# Patient Record
Sex: Female | Born: 1975 | Race: Black or African American | Hispanic: No | Smoking: Never smoker
Health system: Southern US, Community
[De-identification: ages and names within clinical notes are randomized; demographics above are authoritative.]

## PROBLEM LIST (undated history)

## (undated) HISTORY — PX: TUBAL LIGATION: SHX77

## (undated) HISTORY — PX: BREAST SURGERY: SHX581

## (undated) HISTORY — PX: COSMETIC SURGERY: SHX468

---

## 2004-09-05 ENCOUNTER — Emergency Department (HOSPITAL_COMMUNITY): Admission: EM | Admit: 2004-09-05 | Discharge: 2004-09-05 | Payer: Self-pay | Admitting: Emergency Medicine

## 2005-07-26 ENCOUNTER — Other Ambulatory Visit: Admission: RE | Admit: 2005-07-26 | Discharge: 2005-07-26 | Payer: Self-pay | Admitting: Obstetrics & Gynecology

## 2005-12-30 ENCOUNTER — Inpatient Hospital Stay (HOSPITAL_COMMUNITY): Admission: AD | Admit: 2005-12-30 | Discharge: 2006-01-02 | Payer: Self-pay | Admitting: Obstetrics & Gynecology

## 2011-08-02 ENCOUNTER — Encounter: Payer: Self-pay | Admitting: *Deleted

## 2011-09-06 ENCOUNTER — Other Ambulatory Visit: Payer: Self-pay | Admitting: Obstetrics and Gynecology

## 2011-10-23 ENCOUNTER — Other Ambulatory Visit: Payer: Self-pay | Admitting: Internal Medicine

## 2011-10-23 DIAGNOSIS — R221 Localized swelling, mass and lump, neck: Secondary | ICD-10-CM

## 2011-11-21 ENCOUNTER — Other Ambulatory Visit: Payer: Self-pay

## 2012-06-28 ENCOUNTER — Ambulatory Visit: Payer: 59 | Admitting: Emergency Medicine

## 2012-06-28 VITALS — BP 120/80 | HR 93 | Temp 98.3°F | Resp 20 | Ht 64.0 in | Wt 116.0 lb

## 2012-06-28 DIAGNOSIS — R1011 Right upper quadrant pain: Secondary | ICD-10-CM

## 2012-06-28 NOTE — Progress Notes (Signed)
Subjective:    Patient ID: Amanda Rangel, female    DOB: 06/27/1976, 36 y.o.   MRN: [id number]  HPI Comments: Patient is actually complaining about epigastric pain that radiates around to the right side and back.  Pain free while laying still.  Worse when sneezes or laughs.. No food intolerance other than avoiding hamburgers.  No history of gall bladder disease.  Chest Pain  This is a new problem. The current episode started in the past 7 days. The onset quality is sudden. The problem occurs constantly. The problem has been gradually worsening. The pain is present in the lateral region. The pain is moderate. The quality of the pain is described as sharp. The pain radiates to the mid back. Associated symptoms include a cough. Pertinent negatives include no back pain, diaphoresis, dizziness, exertional chest pressure, irregular heartbeat, lower extremity edema, malaise/fatigue, nausea, near-syncope, numbness, palpitations, shortness of breath or weakness. The cough has no precipitants. The cough is productive. Nothing relieves the cough. The pain is aggravated by breathing, coughing, deep breathing and movement. She has tried nothing for the symptoms. Risk factors include no known risk factors.  Her past medical history is significant for diabetes.  Pertinent negatives for past medical history include no aneurysm, no anxiety/panic attacks, no aortic aneurysm, no aortic dissection, no arrhythmia, no bicuspid aortic valve, no cancer, no congenital heart disease, no connective tissue disease, no COPD, no CHF, no hyperhomocysteinemia, no hyperlipidemia, no hypertension, no Kawasaki disease, no mitral valve prolapse, no pacemaker, no PVD, no recent injury, no rheumatic fever, no seizures, no sickle cell disease, no sleep apnea, no spontaneous pneumothorax, no stimulant use, no strokes, no thyroid problem, no TIA, Turner syndrome and no valve disorder.  Her family medical history is significant for CAD in  family, heart disease in family and hypertension in family.  Pertinent negatives for family medical history include: family history of aortic dissection, no connective tissue disease in family, no diabetes in family, no hyperlipidemia in family, no Marfan's syndrome in family, no early MI in family, no PE in family, no PVD in family, no sickle cell disease in family, no stroke in family, no sudden death in family and no TIA in family.      Review of Systems  Constitutional: Negative.  Negative for malaise/fatigue and diaphoresis.  HENT: Negative.   Eyes: Negative.   Respiratory: Positive for cough. Negative for choking, chest tightness, shortness of breath, wheezing and stridor.   Cardiovascular: Positive for chest pain. Negative for palpitations, leg swelling and near-syncope.  Gastrointestinal: Negative.  Negative for nausea.  Genitourinary: Negative.   Musculoskeletal: Negative.  Negative for back pain.  Neurological: Negative for dizziness, seizures, weakness and numbness.       Objective:   Physical Exam  Constitutional: She is oriented to person, place, and time. She appears well-developed and well-nourished. She appears distressed.  HENT:  Head: Normocephalic and atraumatic.  Right Ear: External ear normal.  Left Ear: External ear normal.  Mouth/Throat: Oropharynx is clear and moist.  Eyes: Conjunctivae are normal. Pupils are equal, round, and reactive to light. No scleral icterus.  Neck: Normal range of motion. Neck supple.  Cardiovascular: Normal rate, regular rhythm and normal heart sounds.  Exam reveals no friction rub.   Pulmonary/Chest: Effort normal. She has no rales. She exhibits no tenderness.  Abdominal: Soft. She exhibits mass. There is tenderness (marked tenderness over RUQ.  questionable mass.  ). There is guarding.  Musculoskeletal: Normal range of motion.  Neurological:  She is alert and oriented to person, place, and time.  Skin: Skin is warm and dry.           Assessment & Plan:  RUQ pain To ER for workup tonight.

## 2014-01-23 ENCOUNTER — Other Ambulatory Visit: Payer: Self-pay | Admitting: Obstetrics and Gynecology

## 2015-04-06 ENCOUNTER — Other Ambulatory Visit: Payer: Self-pay | Admitting: Obstetrics and Gynecology

## 2015-04-07 LAB — CYTOLOGY - PAP

## 2016-04-12 ENCOUNTER — Other Ambulatory Visit: Payer: Self-pay | Admitting: Obstetrics and Gynecology

## 2016-04-13 ENCOUNTER — Other Ambulatory Visit: Payer: Self-pay | Admitting: Obstetrics and Gynecology

## 2017-04-20 ENCOUNTER — Ambulatory Visit (INDEPENDENT_AMBULATORY_CARE_PROVIDER_SITE_OTHER): Payer: Managed Care, Other (non HMO) | Admitting: Family Medicine

## 2017-04-20 ENCOUNTER — Ambulatory Visit (INDEPENDENT_AMBULATORY_CARE_PROVIDER_SITE_OTHER): Payer: Managed Care, Other (non HMO)

## 2017-04-20 ENCOUNTER — Encounter: Payer: Self-pay | Admitting: Family Medicine

## 2017-04-20 VITALS — BP 104/76 | HR 65 | Temp 98.3°F | Resp 16 | Ht 64.0 in | Wt 127.0 lb

## 2017-04-20 DIAGNOSIS — E119 Type 2 diabetes mellitus without complications: Secondary | ICD-10-CM | POA: Diagnosis not present

## 2017-04-20 DIAGNOSIS — R109 Unspecified abdominal pain: Secondary | ICD-10-CM

## 2017-04-20 LAB — POCT URINALYSIS DIP (MANUAL ENTRY)
BILIRUBIN UA: NEGATIVE mg/dL
Bilirubin, UA: NEGATIVE
GLUCOSE UA: NEGATIVE mg/dL
Leukocytes, UA: NEGATIVE
Nitrite, UA: NEGATIVE
Protein Ur, POC: NEGATIVE mg/dL
RBC UA: NEGATIVE
UROBILINOGEN UA: 0.2 U/dL
pH, UA: 5.5 (ref 5.0–8.0)

## 2017-04-20 LAB — GLUCOSE, POCT (MANUAL RESULT ENTRY): POC Glucose: 84 mg/dl (ref 70–99)

## 2017-04-20 LAB — POCT GLYCOSYLATED HEMOGLOBIN (HGB A1C): HEMOGLOBIN A1C: 5.7

## 2017-04-20 MED ORDER — FAMOTIDINE 20 MG PO TABS: 20.0000 mg | ORAL_TABLET | Freq: Two times a day (BID) | ORAL | 0 refills | Status: DC

## 2017-04-20 NOTE — Patient Instructions (Addendum)
     IF you received an x-ray today, you will receive an invoice from [facility]. Please contact [facility] at [phone] with questions or concerns regarding your invoice.   IF you received labwork today, you will receive an invoice from [facility]. Please contact [facility] at [phone] with questions or concerns regarding your invoice.   Our billing staff will not be able to assist you with questions regarding bills from these companies.  You will be contacted with the lab results as soon as they are available. The fastest way to get your results is to activate your My Chart account. Instructions are located on the last page of this paperwork. If you have not heard from us regarding the results in 2 weeks, please contact this office.     Abdominal Pain, Adult Many things can cause belly (abdominal) pain. Most times, belly pain is not dangerous. Many cases of belly pain can be watched and treated at home. Sometimes belly pain is serious, though. Your doctor will try to find the cause of your belly pain. Follow these instructions at home:  Take over-the-counter and prescription medicines only as told by your doctor. Do not take medicines that help you poop (laxatives) unless told to by your doctor.  Drink enough fluid to keep your pee (urine) clear or pale yellow.  Watch your belly pain for any changes.  Keep all follow-up visits as told by your doctor. This is important. Contact a doctor if:  Your belly pain changes or gets worse.  You are not hungry, or you lose weight without trying.  You are having trouble pooping (constipated) or have watery poop (diarrhea) for more than 2-3 days.  You have pain when you pee or poop.  Your belly pain wakes you up at night.  Your pain gets worse with meals, after eating, or with certain foods.  You are throwing up and cannot keep anything down.  You have a fever. Get help right away if:  Your pain does not go away  as soon as your doctor says it should.  You cannot stop throwing up.  Your pain is only in areas of your belly, such as the right side or the left lower part of the belly.  You have bloody or black poop, or poop that looks like tar.  You have very bad pain, cramping, or bloating in your belly.  You have signs of not having enough fluid or water in your body (dehydration), such as:  Dark pee, very little pee, or no pee.  Cracked lips.  Dry mouth.  Sunken eyes.  Sleepiness.  Weakness. This information is not intended to replace advice given to you by your health care provider. Make sure you discuss any questions you have with your health care provider. Document Released: 04/10/2008 Document Revised: 05/12/2016 Document Reviewed: 04/05/2016 Elsevier Interactive Patient Education  2017 Elsevier Inc.  

## 2017-04-20 NOTE — Progress Notes (Signed)
Chief Complaint  Patient presents with  . Back Pain    mid right back,  started today around 2;30 PM     HPI  Pt reports that she went to lunch at 2:30pm and could not eat because she started having back spasms in the upper back  She reports that she has no other injuries.  Denies numbness and tingling in her legs Reports that she is urinating without pain States that it happened while she was in line to get her lunch. She states that she has been hungry all morning   She has a history of diabetes that is diet controlled and well controlled  No past medical history on file.  Current Outpatient Prescriptions  Medication Sig Dispense Refill  . famotidine (PEPCID AC MAXIMUM STRENGTH) 20 MG tablet Take 1 tablet (20 mg total) by mouth 2 (two) times daily. 20 tablet 0  . metFORMIN (GLUCOPHAGE) 500 MG tablet Take 500 mg by mouth daily with breakfast.     No current facility-administered medications for this visit.     Allergies: No Known Allergies  Past Surgical History:  Procedure Laterality Date  . TUBAL LIGATION      Social History   Social History  . Marital status: Divorced    Spouse name: N/A  . Number of children: N/A  . Years of education: N/A   Social History Main Topics  . Smoking status: Never Smoker  . Smokeless tobacco: Never Used  . Alcohol use 1.8 oz/week    3 Standard drinks or equivalent per week  . Drug use: No  . Sexual activity: Not Asked   Other Topics Concern  . None   Social History Narrative  . None    Review of Systems  Constitutional: Negative for chills and fever.  Respiratory: Negative for cough and wheezing.   Cardiovascular: Negative for chest pain and palpitations.  Gastrointestinal: Negative for abdominal pain, nausea and vomiting.  Musculoskeletal: Positive for back pain and joint pain. Negative for falls, myalgias and neck pain.    Objective: Vitals:   04/20/17 1642  BP: 104/76  Pulse: 65  Resp: 16  Temp: 98.3 F  (36.8 C)  TempSrc: Oral  SpO2: 100%  Weight: 127 lb (57.6 kg)  Height: 5\' 4"  (1.626 m)    Physical Exam  Constitutional: She is oriented to person, place, and time. She appears well-developed and well-nourished.  HENT:  Head: Normocephalic and atraumatic.  Eyes: Conjunctivae and EOM are normal.  Cardiovascular: Normal rate, regular rhythm and normal heart sounds.   Pulmonary/Chest: Effort normal and breath sounds normal. No respiratory distress.  Musculoskeletal:  Straight leg raise negative.  No SI joint pain. No pain on palpation of the low back or spinous processes  Neurological: She is alert and oriented to person, place, and time. She displays normal reflexes. She exhibits normal muscle tone. Coordination normal.      Lab Results  Component Value Date   HGBA1C 5.7 04/20/2017     Component                           Latest Ref Rng & Units 04/20/2017  POC Glucose                           70 - 99 mg/dl 84   Component     Latest Ref Rng & Units 04/20/2017 04/20/2017  5:34 PM  5:34 PM  Color, UA     yellow yellow   Clarity, UA     clear clear   Glucose     negative mg/dL negative   Bilirubin, UA     negative negative negative  Specific Gravity, UA     1.010 - 1.025 >=1.030 (A)   RBC, UA     negative negative   pH, UA     5.0 - 8.0 5.5   Protein, UA     negative mg/dL negative   Urobilinogen, UA     0.2 or 1.0 E.U./dL 0.2   Nitrite, UA     Negative Negative   Leukocytes, UA     Negative Negative     EXAM: ABDOMEN - 1 VIEW  COMPARISON:  None.  FINDINGS: There is moderate stool in the colon. There is no bowel dilatation or air-fluid level to suggest bowel obstruction. No free air. There are phleboliths in the pelvis.  IMPRESSION: Moderate stool in colon.  No bowel obstruction or free air evident.   Electronically Signed   By: Bretta BangWilliam  Woodruff Rangel M.D.   On: 04/20/2017 17:29    Assessment and Plan Amanda Rangel was seen today for back  pain.  Diagnoses and all orders for this visit:  Flank pain- advised pt that her urine is clear, her xray is negative for constipation or dilated loops of bowel. Pt to drink water and eat to see if this is just gas pain.  If pain persists then return to clinic  -     DG Abd 1 View -     POCT urinalysis dipstick  Diet-controlled diabetes mellitus (HCC)-  Diabetes well controlled with dietary changes -     POCT glucose (manual entry) -     POCT glycosylated hemoglobin (Hb A1C)  Other orders -     famotidine (PEPCID AC MAXIMUM STRENGTH) 20 MG tablet; Take 1 tablet (20 mg total) by mouth 2 (two) times daily.   A total of 30 minutes were spent face-to-face with the patient during this encounter and over half of that time was spent on counseling and coordination of care.   Amanda Rangel

## 2018-06-12 LAB — HM PAP SMEAR: HM Pap smear: NORMAL

## 2019-01-23 ENCOUNTER — Telehealth: Payer: Self-pay | Admitting: Physician Assistant

## 2019-01-23 DIAGNOSIS — J069 Acute upper respiratory infection, unspecified: Secondary | ICD-10-CM

## 2019-01-23 DIAGNOSIS — B9789 Other viral agents as the cause of diseases classified elsewhere: Secondary | ICD-10-CM

## 2019-01-23 MED ORDER — BENZONATATE 100 MG PO CAPS: 100.0000 mg | ORAL_CAPSULE | Freq: Three times a day (TID) | ORAL | 0 refills | Status: DC | PRN

## 2019-01-23 NOTE — Progress Notes (Signed)
I have spent 5 minutes in review of e-visit questionnaire, review and updating patient chart, medical decision making and response to patient.   Kenyada Dosch Cody Uel Davidow, PA-C    

## 2019-01-23 NOTE — Progress Notes (Signed)

## 2019-08-11 ENCOUNTER — Other Ambulatory Visit: Payer: Self-pay | Admitting: Obstetrics and Gynecology

## 2019-08-11 DIAGNOSIS — R928 Other abnormal and inconclusive findings on diagnostic imaging of breast: Secondary | ICD-10-CM

## 2019-08-14 ENCOUNTER — Other Ambulatory Visit: Payer: Self-pay

## 2019-08-14 ENCOUNTER — Ambulatory Visit
Admission: RE | Admit: 2019-08-14 | Discharge: 2019-08-14 | Disposition: A | Discharge status: Home / Self Care | Payer: Managed Care, Other (non HMO) | Source: Ambulatory Visit | Attending: Obstetrics and Gynecology | Admitting: Obstetrics and Gynecology

## 2019-08-14 DIAGNOSIS — R928 Other abnormal and inconclusive findings on diagnostic imaging of breast: Secondary | ICD-10-CM

## 2019-10-08 ENCOUNTER — Other Ambulatory Visit: Payer: Self-pay

## 2019-10-08 DIAGNOSIS — Z20822 Contact with and (suspected) exposure to covid-19: Secondary | ICD-10-CM

## 2019-10-10 LAB — NOVEL CORONAVIRUS, NAA: SARS-CoV-2, NAA: NOT DETECTED

## 2021-12-07 ENCOUNTER — Other Ambulatory Visit: Payer: Self-pay | Admitting: Obstetrics and Gynecology

## 2021-12-07 DIAGNOSIS — Z1231 Encounter for screening mammogram for malignant neoplasm of breast: Secondary | ICD-10-CM

## 2021-12-16 ENCOUNTER — Other Ambulatory Visit: Payer: Self-pay | Admitting: Obstetrics and Gynecology

## 2021-12-16 ENCOUNTER — Ambulatory Visit
Admission: RE | Admit: 2021-12-16 | Discharge: 2021-12-16 | Disposition: A | Discharge status: Home / Self Care | Payer: Managed Care, Other (non HMO) | Source: Ambulatory Visit | Attending: Obstetrics and Gynecology | Admitting: Obstetrics and Gynecology

## 2021-12-16 DIAGNOSIS — Z1231 Encounter for screening mammogram for malignant neoplasm of breast: Secondary | ICD-10-CM

## 2022-08-07 LAB — HEMOGLOBIN A1C: Hemoglobin A1C: 6

## 2022-08-07 LAB — LIPID PANEL: LDL Cholesterol: 119

## 2022-09-21 DIAGNOSIS — N926 Irregular menstruation, unspecified: Secondary | ICD-10-CM | POA: Insufficient documentation

## 2022-11-29 ENCOUNTER — Encounter: Payer: Self-pay | Admitting: Family Medicine

## 2022-11-29 ENCOUNTER — Ambulatory Visit: Payer: 59 | Admitting: Family Medicine

## 2022-11-29 VITALS — BP 104/72 | HR 76 | Temp 97.6°F | Ht 64.0 in | Wt 153.0 lb

## 2022-11-29 DIAGNOSIS — R7303 Prediabetes: Secondary | ICD-10-CM | POA: Insufficient documentation

## 2022-11-29 DIAGNOSIS — R233 Spontaneous ecchymoses: Secondary | ICD-10-CM

## 2022-11-29 DIAGNOSIS — E785 Hyperlipidemia, unspecified: Secondary | ICD-10-CM | POA: Diagnosis not present

## 2022-11-29 DIAGNOSIS — Z7689 Persons encountering health services in other specified circumstances: Secondary | ICD-10-CM

## 2022-11-29 NOTE — Progress Notes (Signed)
New Patient Office Visit  Subjective    Patient ID: Amanda Rangel, female    DOB: 04-07-1976  Age: 47 y.o. MRN: [id number]  CC:  Chief Complaint  Patient presents with   Establish Care    Cholesterol levels and A1c per OBGYN    HPI Amanda Rangel presents to establish care  OB/GYN - Dr. Philis Pique  States she has prediabetes - she took metformin for approx 6 months.  Last A1c 5.9%.  She went to a nutritionist.   States her cholesterol was also elevated. Cannot recall LDL or HDL.   Bruises easily. No bleeding.   Denies fever, chills, dizziness, chest pain, palpitations, shortness of breath, abdominal pain, N/V/D, urinary symptoms, LE edema.    Works as Pension scheme manager job.  Divorced. Children and grandchildren.     Outpatient Encounter Medications as of 11/29/2022  Medication Sig   [DISCONTINUED] benzonatate (TESSALON) 100 MG capsule Take 1 capsule (100 mg total) by mouth 3 (three) times daily as needed for cough. (Patient not taking: Reported on 11/29/2022)   [DISCONTINUED] famotidine (PEPCID AC MAXIMUM STRENGTH) 20 MG tablet Take 1 tablet (20 mg total) by mouth 2 (two) times daily. (Patient not taking: Reported on 11/29/2022)   [DISCONTINUED] metFORMIN (GLUCOPHAGE) 500 MG tablet Take 500 mg by mouth daily with breakfast.   No facility-administered encounter medications on file as of 11/29/2022.    History reviewed. No pertinent past medical history.  Past Surgical History:  Procedure Laterality Date   TUBAL LIGATION      Family History  Problem Relation Age of Onset   Hyperlipidemia Father     Social History   Socioeconomic History   Marital status: Divorced    Spouse name: Not on file   Number of children: Not on file   Years of education: Not on file   Highest education level: Not on file  Occupational History   Not on file  Tobacco Use   Smoking status: Never   Smokeless tobacco: Never  Substance and Sexual Activity   Alcohol use: Yes     Alcohol/week: 3.0 standard drinks of alcohol    Types: 3 Standard drinks or equivalent per week   Drug use: No   Sexual activity: Not on file  Other Topics Concern   Not on file  Social History Narrative   Not on file   Social Determinants of Health   Financial Resource Strain: Not on file  Food Insecurity: Not on file  Transportation Needs: Not on file  Physical Activity: Not on file  Stress: Not on file  Social Connections: Not on file  Intimate Partner Violence: Not on file    ROS      Objective    BP 104/72 (BP Location: Left Arm, Patient Position: Sitting, Cuff Size: Large)   Pulse 76   Temp 97.6 F (36.4 C) (Temporal)   Ht 5\' 4"  (1.626 m)   Wt 153 lb (69.4 kg)   SpO2 99%   BMI 26.26 kg/m   Physical Exam Constitutional:      General: She is not in acute distress.    Appearance: She is not ill-appearing.  Cardiovascular:     Rate and Rhythm: Normal rate and regular rhythm.     Heart sounds: Normal heart sounds.  Pulmonary:     Effort: Pulmonary effort is normal.     Breath sounds: Normal breath sounds.  Musculoskeletal:     Cervical back: Normal range of motion and neck supple.  Skin:    General: Skin is warm and dry.  Neurological:     General: No focal deficit present.     Mental Status: She is alert and oriented to person, place, and time.  Psychiatric:        Mood and Affect: Mood normal.        Behavior: Behavior normal.        Thought Content: Thought content normal.         Assessment & Plan:   Problem List Items Addressed This Visit       Other   Easy bruising   Hyperlipidemia   Prediabetes - Primary   Other Visit Diagnoses     Encounter to establish care          She is a pleasant 47 year old female who is new to the practice and here to establish care.  Recently saw her OB/GYN and was advised to schedule with a PCP due to prediabetes and hyperlipidemia.  I do not have the lab results or office notes but I will request  them. Counseling on healthy diet and exercise specifically cutting back on sugar and carbohydrates for prediabetes.  Will hold off on metformin for now. Discussed cutting back on fried food for cholesterol. Reviewed risk factors for heart disease and they are few. She will work on diet lifestyle and follow-up in 2 months for a fasting CPE. Declines flu shot.  Return in about 2 months (around 01/28/2023) for fasting CPE.   Amanda Dingwall, NP-C

## 2022-11-29 NOTE — Patient Instructions (Addendum)
Cut back on sugar and carbohydrates (potatoes, pasta, bread, rice, etc).  Try to get at least 150 minutes of physical activity each week.    Prediabetes Eating Plan Prediabetes is a condition that causes blood sugar (glucose) levels to be higher than normal. This increases the risk for developing type 2 diabetes (type 2 diabetes mellitus). Working with a health care provider or nutrition specialist (dietitian) to make diet and lifestyle changes can help prevent the onset of diabetes. These changes may help you: Control your blood glucose levels. Improve your cholesterol levels. Manage your blood pressure. What are tips for following this plan? Reading food labels Read food labels to check the amount of fat, salt (sodium), and sugar in prepackaged foods. Avoid foods that have: Saturated fats. Trans fats. Added sugars. Avoid foods that have more than 300 milligrams (mg) of sodium per serving. Limit your sodium intake to less than 2,300 mg each day. Shopping Avoid buying pre-made and processed foods. Avoid buying drinks with added sugar. Cooking Cook with olive oil. Do not use butter, lard, or ghee. Bake, broil, grill, steam, or boil foods. Avoid frying. Meal planning  Work with your dietitian to create an eating plan that is right for you. This may include tracking how many calories you take in each day. Use a food diary, notebook, or mobile application to track what you eat at each meal. Consider following a Mediterranean diet. This includes: Eating several servings of fresh fruits and vegetables each day. Eating fish at least twice a week. Eating one serving each day of whole grains, beans, nuts, and seeds. Using olive oil instead of other fats. Limiting alcohol. Limiting red meat. Using nonfat or low-fat dairy products. Consider following a plant-based diet. This includes dietary choices that focus on eating mostly vegetables and fruit, grains, beans, nuts, and seeds. If you have  high blood pressure, you may need to limit your sodium intake or follow a diet such as the DASH (Dietary Approaches to Stop Hypertension) eating plan. The DASH diet aims to lower high blood pressure. Lifestyle Set weight loss goals with help from your health care team. It is recommended that most people with prediabetes lose 7% of their body weight. Exercise for at least 30 minutes 5 or more days a week. Attend a support group or seek support from a mental health counselor. Take over-the-counter and prescription medicines only as told by your health care provider. What foods are recommended? Fruits Berries. Bananas. Apples. Oranges. Grapes. Papaya. Mango. Pomegranate. Kiwi. Grapefruit. Cherries. Vegetables Lettuce. Spinach. Peas. Beets. Cauliflower. Cabbage. Broccoli. Carrots. Tomatoes. Squash. Eggplant. Herbs. Peppers. Onions. Cucumbers. Brussels sprouts. Grains Whole grains, such as whole-wheat or whole-grain breads, crackers, cereals, and pasta. Unsweetened oatmeal. Bulgur. Barley. Quinoa. Brown rice. Corn or whole-wheat flour tortillas or taco shells. Meats and other proteins Seafood. Poultry without skin. Lean cuts of pork and beef. Tofu. Eggs. Nuts. Beans. Dairy Low-fat or fat-free dairy products, such as yogurt, cottage cheese, and cheese. Beverages Water. Tea. Coffee. Sugar-free or diet soda. Seltzer water. Low-fat or nonfat milk. Milk alternatives, such as soy or almond milk. Fats and oils Olive oil. Canola oil. Sunflower oil. Grapeseed oil. Avocado. Walnuts. Sweets and desserts Sugar-free or low-fat pudding. Sugar-free or low-fat ice cream and other frozen treats. Seasonings and condiments Herbs. Sodium-free spices. Mustard. Relish. Low-salt, low-sugar ketchup. Low-salt, low-sugar barbecue sauce. Low-fat or fat-free mayonnaise. The items listed above may not be a complete list of recommended foods and beverages. Contact a dietitian for more information.  What foods are not  recommended? Fruits Fruits canned with syrup. Vegetables Canned vegetables. Frozen vegetables with butter or cream sauce. Grains Refined white flour and flour products, such as bread, pasta, snack foods, and cereals. Meats and other proteins Fatty cuts of meat. Poultry with skin. Breaded or fried meat. Processed meats. Dairy Full-fat yogurt, cheese, or milk. Beverages Sweetened drinks, such as iced tea and soda. Fats and oils Butter. Lard. Ghee. Sweets and desserts Baked goods, such as cake, cupcakes, pastries, cookies, and cheesecake. Seasonings and condiments Spice mixes with added salt. Ketchup. Barbecue sauce. Mayonnaise. The items listed above may not be a complete list of foods and beverages that are not recommended. Contact a dietitian for more information. Where to find more information American Diabetes Association: www.diabetes.org Summary You may need to make diet and lifestyle changes to help prevent the onset of diabetes. These changes can help you control blood sugar, improve cholesterol levels, and manage blood pressure. Set weight loss goals with help from your health care team. It is recommended that most people with prediabetes lose 7% of their body weight. Consider following a Mediterranean diet. This includes eating plenty of fresh fruits and vegetables, whole grains, beans, nuts, seeds, fish, and low-fat dairy, and using olive oil instead of other fats. This information is not intended to replace advice given to you by your health care provider. Make sure you discuss any questions you have with your health care provider. Document Revised: 01/22/2020 Document Reviewed: 01/22/2020 Elsevier Patient Education  [employer].

## 2023-01-30 ENCOUNTER — Encounter: Payer: 59 | Admitting: Family Medicine

## 2023-02-06 ENCOUNTER — Encounter: Payer: Self-pay | Admitting: Family Medicine

## 2023-02-06 ENCOUNTER — Ambulatory Visit (INDEPENDENT_AMBULATORY_CARE_PROVIDER_SITE_OTHER): Payer: 59 | Admitting: Family Medicine

## 2023-02-06 VITALS — BP 94/66 | HR 69 | Temp 97.7°F | Ht 64.0 in | Wt 153.0 lb

## 2023-02-06 DIAGNOSIS — Z1211 Encounter for screening for malignant neoplasm of colon: Secondary | ICD-10-CM | POA: Diagnosis not present

## 2023-02-06 DIAGNOSIS — Z0001 Encounter for general adult medical examination with abnormal findings: Secondary | ICD-10-CM | POA: Diagnosis not present

## 2023-02-06 DIAGNOSIS — R7303 Prediabetes: Secondary | ICD-10-CM | POA: Diagnosis not present

## 2023-02-06 DIAGNOSIS — N926 Irregular menstruation, unspecified: Secondary | ICD-10-CM

## 2023-02-06 DIAGNOSIS — E785 Hyperlipidemia, unspecified: Secondary | ICD-10-CM | POA: Diagnosis not present

## 2023-02-06 LAB — CBC WITH DIFFERENTIAL/PLATELET
Basophils Absolute: 0 10*3/uL (ref 0.0–0.1)
Basophils Relative: 0.3 % (ref 0.0–3.0)
Eosinophils Absolute: 0 10*3/uL (ref 0.0–0.7)
Eosinophils Relative: 0.7 % (ref 0.0–5.0)
HCT: 43.9 % (ref 36.0–46.0)
Hemoglobin: 14.4 g/dL (ref 12.0–15.0)
Lymphocytes Relative: 34.6 % (ref 12.0–46.0)
Lymphs Abs: 2.2 10*3/uL (ref 0.7–4.0)
MCHC: 32.7 g/dL (ref 30.0–36.0)
MCV: 82.3 fl (ref 78.0–100.0)
Monocytes Absolute: 0.4 10*3/uL (ref 0.1–1.0)
Monocytes Relative: 6.1 % (ref 3.0–12.0)
Neutro Abs: 3.7 10*3/uL (ref 1.4–7.7)
Neutrophils Relative %: 58.3 % (ref 43.0–77.0)
Platelets: 391 10*3/uL (ref 150.0–400.0)
RBC: 5.34 Mil/uL — ABNORMAL HIGH (ref 3.87–5.11)
RDW: 14.4 % (ref 11.5–15.5)
WBC: 6.3 10*3/uL (ref 4.0–10.5)

## 2023-02-06 LAB — COMPREHENSIVE METABOLIC PANEL
ALT: 11 U/L (ref 0–35)
AST: 15 U/L (ref 0–37)
Albumin: 4.1 g/dL (ref 3.5–5.2)
Alkaline Phosphatase: 68 U/L (ref 39–117)
BUN: 17 mg/dL (ref 6–23)
CO2: 27 mEq/L (ref 19–32)
Calcium: 9.2 mg/dL (ref 8.4–10.5)
Chloride: 104 mEq/L (ref 96–112)
Creatinine, Ser: 0.83 mg/dL (ref 0.40–1.20)
GFR: 83.3 mL/min (ref >60.00)
Glucose, Bld: 84 mg/dL (ref 70–99)
Potassium: 4.1 mEq/L (ref 3.5–5.1)
Sodium: 137 mEq/L (ref 135–145)
Total Bilirubin: 0.4 mg/dL (ref 0.2–1.2)
Total Protein: 7.6 g/dL (ref 6.0–8.3)

## 2023-02-06 LAB — LIPID PANEL
Cholesterol: 170 mg/dL (ref 0–200)
HDL: 50.9 mg/dL (ref >39.00)
LDL Cholesterol: 103 mg/dL — ABNORMAL HIGH (ref 0–99)
NonHDL: 119.19
Total CHOL/HDL Ratio: 3
Triglycerides: 80 mg/dL (ref 0.0–149.0)
VLDL: 16 mg/dL (ref 0.0–40.0)

## 2023-02-06 LAB — TSH: TSH: 1.33 u[IU]/mL (ref 0.35–5.50)

## 2023-02-06 LAB — HEMOGLOBIN A1C: Hgb A1c MFr Bld: 6 % (ref 4.6–6.5)

## 2023-02-06 NOTE — Progress Notes (Addendum)
Complete physical exam  Patient: Amanda Rangel   DOB: [birth year]   47 y.o. Female  MRN: [id number]  Subjective:    Chief Complaint  Patient presents with   Annual Exam    fasting   She is fairly new to the practice and here for a complete physical exam.  OB/GYN - Dr. Philis Pique   States she has prediabetes - she took metformin for approx 6 months.  Last A1c 5.9%.  She went to a nutritionist.  Metformin caused constipation    States her cholesterol was also elevated. Cannot recall LDL or HDL.    Bruises easily. No bleeding.   Right ear intermittently feels clogged. She uses OTC ear wax removal. Uses Q-tips  Mammogram and pap smear- last year with OB/GYN  Last colon cancer screening- never   Works as accounts payable job.  Divorced. Children and grandchildren.    Health Maintenance  Topic Date Due   HIV Screening  Never done   Hepatitis C Screening: USPSTF Recommendation to screen - Ages 5-79 yo.  Never done   Pap Smear  06/12/2021   COVID-19 Vaccine (3 - 2023-24 season) 02/22/2023*   Colon Cancer Screening  11/30/2023*   Flu Shot  06/07/2023   DTaP/Tdap/Td vaccine (3 - Td or Tdap) 04/20/2024   HPV Vaccine  Aged Out  *Topic was postponed. The date shown is not the original due date.    Wears seatbelt always, uses sunscreen, smoke detectors in home and functioning, does not text while driving, feels safe in home environment.  Depression screening:    02/06/2023    9:22 AM 11/29/2022    8:05 AM 04/20/2017    4:33 PM  Depression screen PHQ 2/9  Decreased Interest 0 0 0  Down, Depressed, Hopeless 0 0 0  PHQ - 2 Score 0 0 0   Anxiety Screening:    11/29/2022    8:05 AM  GAD 7 : Generalized Anxiety Score  Nervous, Anxious, on Edge 1  Control/stop worrying 1  Worry too much - different things 0  Trouble relaxing 0  Restless 0  Easily annoyed or irritable 1  Afraid - awful might happen 0  Total GAD 7 Score 3    Vision:Within last year, Dental: No current  dental problems and Receives regular dental care, and STD: declines testing   Patient Active Problem List   Diagnosis Date Noted   Prediabetes 11/29/2022   Hyperlipidemia 11/29/2022   Easy bruising 11/29/2022   Irregular periods 09/21/2022   History reviewed. No pertinent past medical history. Past Surgical History:  Procedure Laterality Date   BREAST SURGERY  01/2020   Breast augmentation   COSMETIC SURGERY  01/2020   TUBAL LIGATION     Social History   Tobacco Use   Smoking status: Never   Smokeless tobacco: Never  Substance Use Topics   Alcohol use: Yes    Alcohol/week: 3.0 standard drinks of alcohol    Types: 3 Standard drinks or equivalent per week   Drug use: No      Patient Care Team: Girtha Rm, NP-C as PCP - General (Family Medicine) Ob/Gyn, Esmond Plants   Outpatient Medications Prior to Visit  Medication Sig   HAILEY 24 FE 1-20 MG-MCG(24) tablet Take 1 tablet by mouth daily.   No facility-administered medications prior to visit.    Review of Systems  Constitutional:  Negative for chills and fever.  HENT:  Negative for congestion, ear pain, sinus pain and sore throat.  Eyes:  Negative for blurred vision, double vision and pain.  Respiratory:  Negative for cough, shortness of breath and wheezing.   Cardiovascular:  Negative for chest pain, palpitations and leg swelling.  Gastrointestinal:  Negative for abdominal pain, constipation, diarrhea, nausea and vomiting.  Genitourinary:  Negative for dysuria, frequency and urgency.  Musculoskeletal:  Negative for back pain, joint pain and myalgias.  Skin:  Negative for rash.  Neurological:  Negative for dizziness, tingling, focal weakness and headaches.  Endo/Heme/Allergies:  Does not bruise/bleed easily.  Psychiatric/Behavioral:  Negative for depression, memory loss and suicidal ideas. The patient is not nervous/anxious.        Objective:    BP 94/66 (BP Location: Left Arm, Patient Position: Sitting,  Cuff Size: Large)   Pulse 69   Temp 97.7 F (36.5 C) (Temporal)   Ht 5\' 4"  (1.626 m)   Wt 153 lb (69.4 kg)   SpO2 98%   BMI 26.26 kg/m  BP Readings from Last 3 Encounters:  02/06/23 94/66  11/29/22 104/72  04/20/17 104/76   Wt Readings from Last 3 Encounters:  02/06/23 153 lb (69.4 kg)  11/29/22 153 lb (69.4 kg)  04/20/17 127 lb (57.6 kg)    Physical Exam Constitutional:      General: She is not in acute distress. HENT:     Right Ear: Tympanic membrane, ear canal and external ear normal.     Left Ear: Tympanic membrane, ear canal and external ear normal.     Nose: Nose normal.     Mouth/Throat:     Mouth: Mucous membranes are moist.     Pharynx: Oropharynx is clear.  Eyes:     Extraocular Movements: Extraocular movements intact.     Conjunctiva/sclera: Conjunctivae normal.     Pupils: Pupils are equal, round, and reactive to light.  Neck:     Thyroid: No thyroid mass, thyromegaly or thyroid tenderness.  Cardiovascular:     Rate and Rhythm: Normal rate and regular rhythm.     Pulses: Normal pulses.     Heart sounds: Normal heart sounds.  Pulmonary:     Effort: Pulmonary effort is normal.     Breath sounds: Normal breath sounds.  Abdominal:     General: Bowel sounds are normal.     Palpations: Abdomen is soft.     Tenderness: There is no abdominal tenderness. There is no right CVA tenderness, left CVA tenderness, guarding or rebound.  Musculoskeletal:        General: Normal range of motion.     Cervical back: Normal range of motion and neck supple. No tenderness.     Right lower leg: No edema.     Left lower leg: No edema.  Lymphadenopathy:     Cervical: No cervical adenopathy.  Skin:    General: Skin is warm and dry.     Findings: No lesion or rash.  Neurological:     General: No focal deficit present.     Mental Status: She is alert and oriented to person, place, and time.     Cranial Nerves: No cranial nerve deficit.     Sensory: No sensory deficit.      Motor: No weakness.     Gait: Gait normal.  Psychiatric:        Mood and Affect: Mood normal.        Behavior: Behavior normal.        Thought Content: Thought content normal.      Results for orders placed  or performed in visit on 02/06/23  HM PAP SMEAR  Result Value Ref Range   HM Pap smear normal   Results for orders placed or performed in visit on 02/06/23  TSH  Result Value Ref Range   TSH 1.33 0.35 - 5.50 uIU/mL  Hemoglobin A1c  Result Value Ref Range   Hgb A1c MFr Bld 6.0 4.6 - 6.5 %  Lipid panel  Result Value Ref Range   Cholesterol 170 0 - 200 mg/dL   Triglycerides 80.0 0.0 - 149.0 mg/dL   HDL 50.90 >39.00 mg/dL   VLDL 16.0 0.0 - 40.0 mg/dL   LDL Cholesterol 103 (H) 0 - 99 mg/dL   Total CHOL/HDL Ratio 3    NonHDL 119.19   Comprehensive metabolic panel  Result Value Ref Range   Sodium 137 135 - 145 mEq/L   Potassium 4.1 3.5 - 5.1 mEq/L   Chloride 104 96 - 112 mEq/L   CO2 27 19 - 32 mEq/L   Glucose, Bld 84 70 - 99 mg/dL   BUN 17 6 - 23 mg/dL   Creatinine, Ser 0.83 0.40 - 1.20 mg/dL   Total Bilirubin 0.4 0.2 - 1.2 mg/dL   Alkaline Phosphatase 68 39 - 117 U/L   AST 15 0 - 37 U/L   ALT 11 0 - 35 U/L   Total Protein 7.6 6.0 - 8.3 g/dL   Albumin 4.1 3.5 - 5.2 g/dL   GFR 83.30 >60.00 mL/min   Calcium 9.2 8.4 - 10.5 mg/dL  CBC with Differential/Platelet  Result Value Ref Range   WBC 6.3 4.0 - 10.5 K/uL   RBC 5.34 (H) 3.87 - 5.11 Mil/uL   Hemoglobin 14.4 12.0 - 15.0 g/dL   HCT 43.9 36.0 - 46.0 %   MCV 82.3 78.0 - 100.0 fl   MCHC 32.7 30.0 - 36.0 g/dL   RDW 14.4 11.5 - 15.5 %   Platelets 391.0 150.0 - 400.0 K/uL   Neutrophils Relative % 58.3 43.0 - 77.0 %   Lymphocytes Relative 34.6 12.0 - 46.0 %   Monocytes Relative 6.1 3.0 - 12.0 %   Eosinophils Relative 0.7 0.0 - 5.0 %   Basophils Relative 0.3 0.0 - 3.0 %   Neutro Abs 3.7 1.4 - 7.7 K/uL   Lymphs Abs 2.2 0.7 - 4.0 K/uL   Monocytes Absolute 0.4 0.1 - 1.0 K/uL   Eosinophils Absolute 0.0 0.0 - 0.7 K/uL    Basophils Absolute 0.0 0.0 - 0.1 K/uL      Assessment & Plan:    Routine Health Maintenance and Physical Exam  Problem List Items Addressed This Visit       Other   Hyperlipidemia   Relevant Orders   Lipid panel (Completed)   Irregular periods   Relevant Orders   CBC with Differential/Platelet (Completed)   Comprehensive metabolic panel (Completed)   TSH (Completed)   Prediabetes   Relevant Orders   CBC with Differential/Platelet (Completed)   Comprehensive metabolic panel (Completed)   Hemoglobin A1c (Completed)   Other Visit Diagnoses     Encounter for general adult medical examination with abnormal findings    -  Primary   Screen for colon cancer       Relevant Orders   Ambulatory referral to Gastroenterology      Preventive health care reviewed.  Counseling on healthy lifestyle including diet and exercise.  Recommend regular dental and eye exams.   She has never had a colonoscopy. Would like referral.  Follow up with  OB/GYN Immunizations reviewed.  Discussed safety.   Return in about 1 year (around 02/06/2024).     Harland Dingwall, NP-C

## 2023-02-06 NOTE — Patient Instructions (Signed)
Preventive Care 49-49 Years Old, Female Preventive care refers to lifestyle choices and visits with your health care provider that can promote health and wellness. Preventive care visits are also called wellness exams. What can I expect for my preventive care visit? Counseling Your health care provider may ask you questions about your: Medical history, including: Past medical problems. Family medical history. Pregnancy history. Current health, including: Menstrual cycle. Method of birth control. Emotional well-being. Home life and relationship well-being. Sexual activity and sexual health. Lifestyle, including: Alcohol, nicotine or tobacco, and drug use. Access to firearms. Diet, exercise, and sleep habits. Work and work environment. Sunscreen use. Safety issues such as seatbelt and bike helmet use. Physical exam Your health care provider will check your: Height and weight. These may be used to calculate your BMI (body mass index). BMI is a measurement that tells if you are at a healthy weight. Waist circumference. This measures the distance around your waistline. This measurement also tells if you are at a healthy weight and may help predict your risk of certain diseases, such as type 2 diabetes and high blood pressure. Heart rate and blood pressure. Body temperature. Skin for abnormal spots. What immunizations do I need?  Vaccines are usually given at various ages, according to a schedule. Your health care provider will recommend vaccines for you based on your age, medical history, and lifestyle or other factors, such as travel or where you work. What tests do I need? Screening Your health care provider may recommend screening tests for certain conditions. This may include: Lipid and cholesterol levels. Diabetes screening. This is done by checking your blood sugar (glucose) after you have not eaten for a while (fasting). Pelvic exam and Pap test. Hepatitis B test. Hepatitis C  test. HIV (human immunodeficiency virus) test. STI (sexually transmitted infection) testing, if you are at risk. Lung cancer screening. Colorectal cancer screening. Mammogram. Talk with your health care provider about when you should start having regular mammograms. This may depend on whether you have a family history of breast cancer. BRCA-related cancer screening. This may be done if you have a family history of breast, ovarian, tubal, or peritoneal cancers. Bone density scan. This is done to screen for osteoporosis. Talk with your health care provider about your test results, treatment options, and if necessary, the need for more tests. Follow these instructions at home: Eating and drinking  Eat a diet that includes fresh fruits and vegetables, whole grains, lean protein, and low-fat dairy products. Take vitamin and mineral supplements as recommended by your health care provider. Do not drink alcohol if: Your health care provider tells you not to drink. You are pregnant, may be pregnant, or are planning to become pregnant. If you drink alcohol: Limit how much you have to 49-1 drink a day. Know how much alcohol is in your drink. In the [place] one drink equals one 12 oz bottle of beer (355 mL), one 5 oz glass of wine (148 mL), or one 1 oz glass of hard liquor (44 mL). Lifestyle Brush your teeth every morning and night with fluoride toothpaste. Floss one time each day. Exercise for at least 30 minutes 5 or more days each week. Do not use any products that contain nicotine or tobacco. These products include cigarettes, chewing tobacco, and vaping devices, such as e-cigarettes. If you need help quitting, ask your health care provider. Do not use drugs. If you are sexually active, practice safe sex. Use a condom or other form of protection to   prevent STIs. If you do not wish to become pregnant, use a form of birth control. If you plan to become pregnant, see your health care provider for a  prepregnancy visit. Take aspirin only as told by your health care provider. Make sure that you understand how much to take and what form to take. Work with your health care provider to find out whether it is safe and beneficial for you to take aspirin daily. Find healthy ways to manage stress, such as: Meditation, yoga, or listening to music. Journaling. Talking to a trusted person. Spending time with friends and family. Minimize exposure to UV radiation to reduce your risk of skin cancer. Safety Always wear your seat belt while driving or riding in a vehicle. Do not drive: If you have been drinking alcohol. Do not ride with someone who has been drinking. When you are tired or distracted. While texting. If you have been using any mind-altering substances or drugs. Wear a helmet and other protective equipment during sports activities. If you have firearms in your house, make sure you follow all gun safety procedures. Seek help if you have been physically or sexually abused. What's next? Visit your health care provider once a year for an annual wellness visit. Ask your health care provider how often you should have your eyes and teeth checked. Stay up to date on all vaccines. This information is not intended to replace advice given to you by your health care provider. Make sure you discuss any questions you have with your health care provider. Document Revised: 04/20/2021 Document Reviewed: 04/20/2021 Elsevier Patient Education  2023 [employer]  

## 2023-10-29 IMAGING — MG DIGITAL SCREENING BREAST BILAT IMPLANT W/ TOMO W/ CAD
8 of 12 series · 8 of 28 positions shown · non-contrast
Comparison: Previous exam(s).

CLINICAL DATA: Screening.

EXAM:
DIGITAL SCREENING BILATERAL MAMMOGRAM WITH IMPLANTS, CAD AND
TOMOSYNTHESIS
TECHNIQUE: Bilateral screening digital craniocaudal and mediolateral oblique
mammograms were obtained. Bilateral screening digital breast
tomosynthesis was performed. The images were evaluated with
computer-aided detection. Standard and/or implant displaced views
were performed.

[R CC]
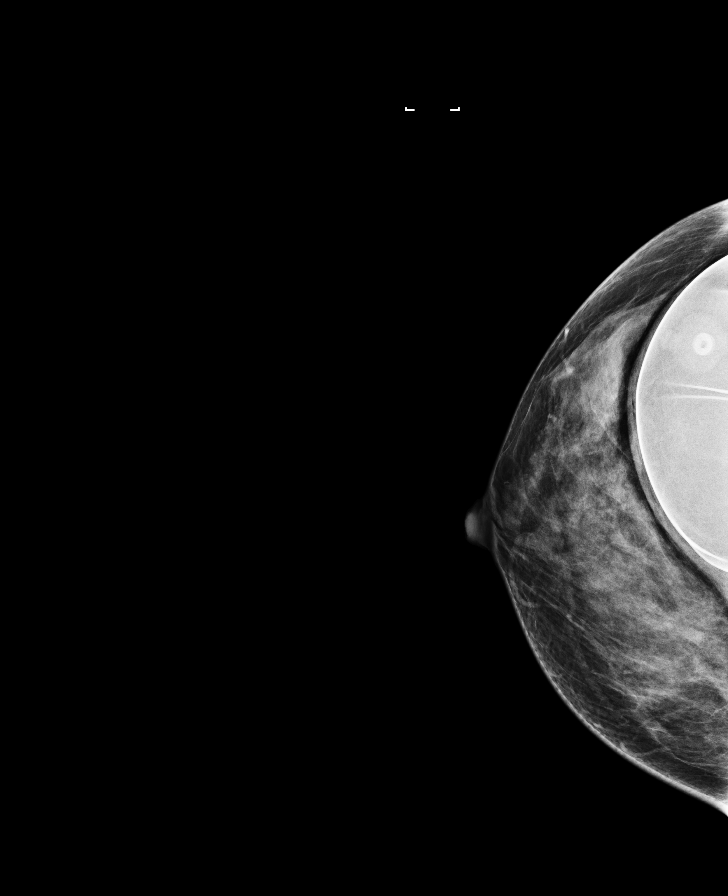

[R MLO]
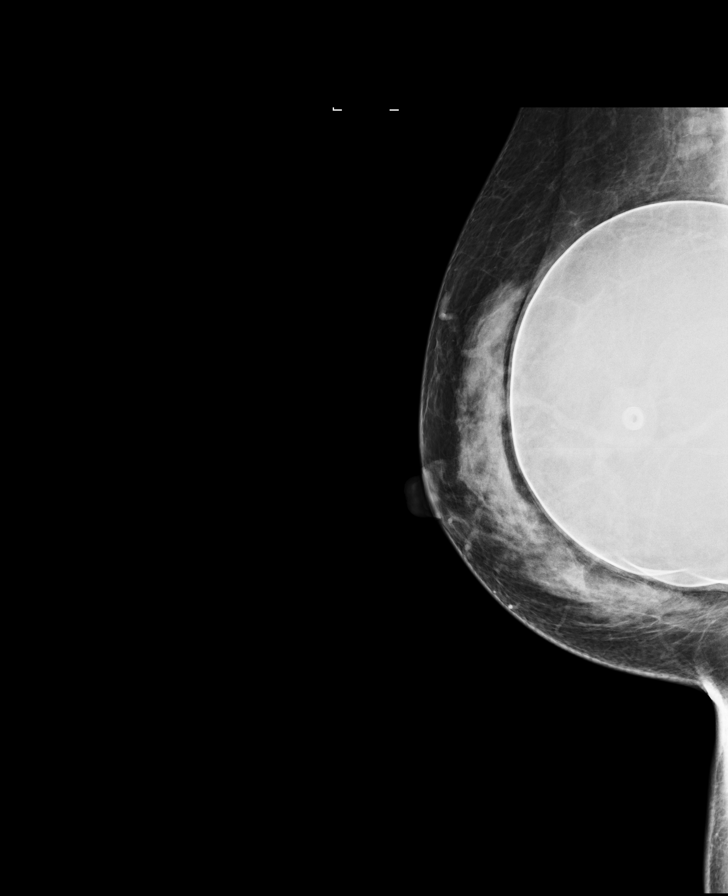

[L CC]
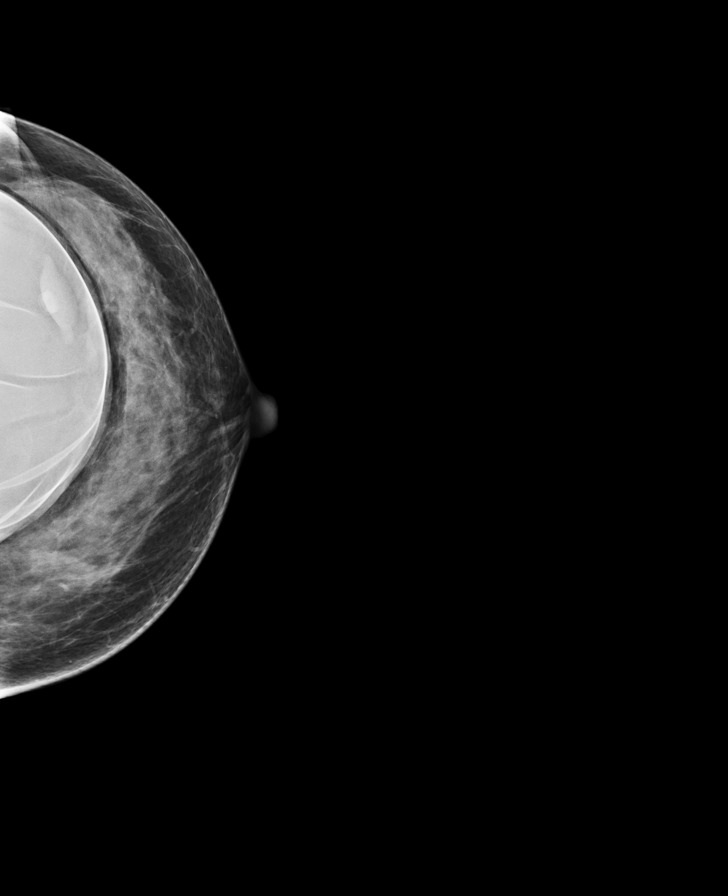

[L MLO]
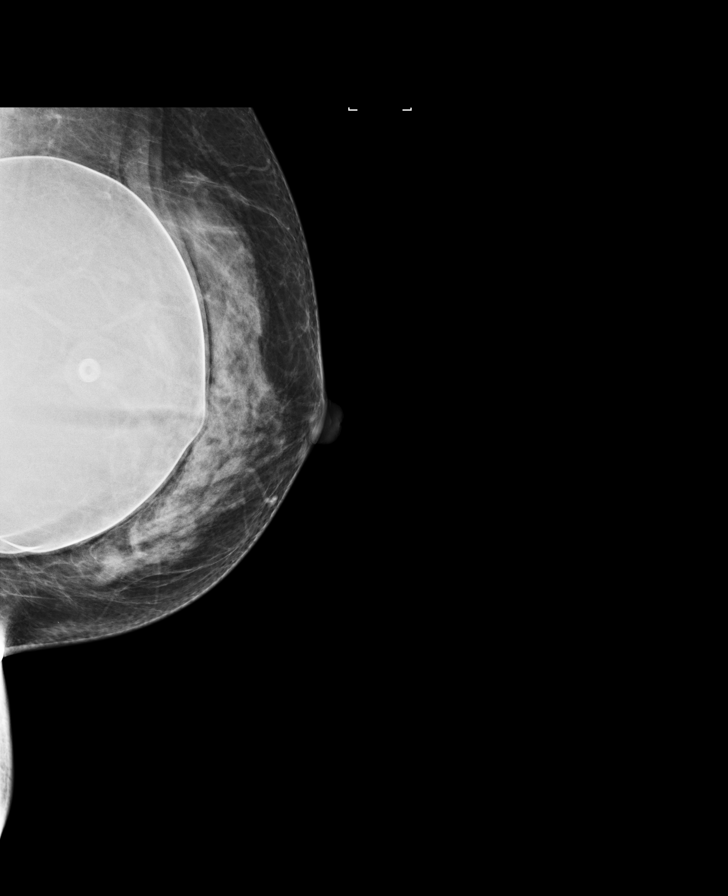

[L CC synth-2D]
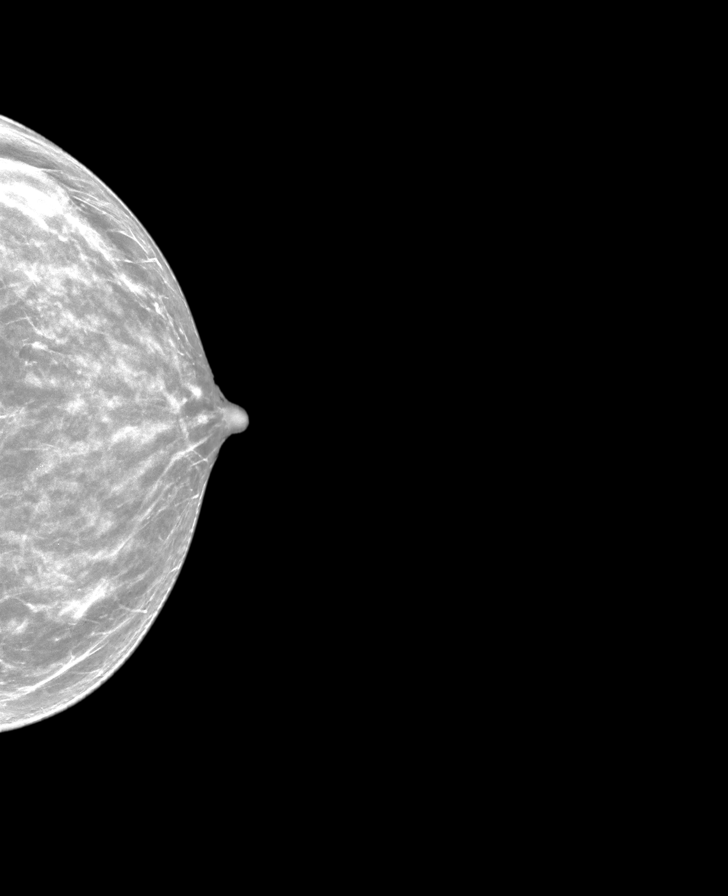

[L MLO synth-2D]
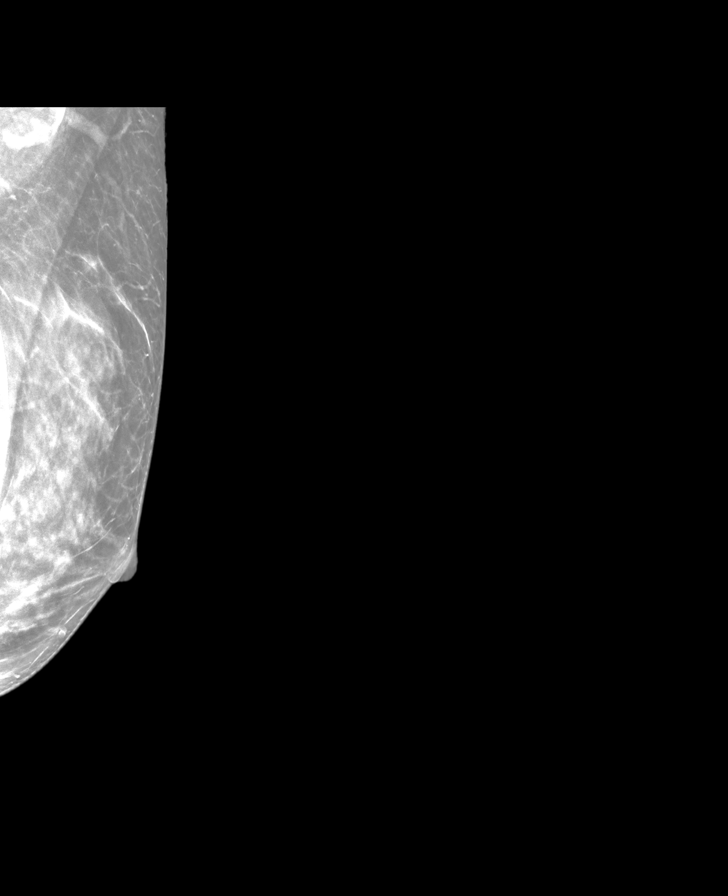

[R CC synth-2D]
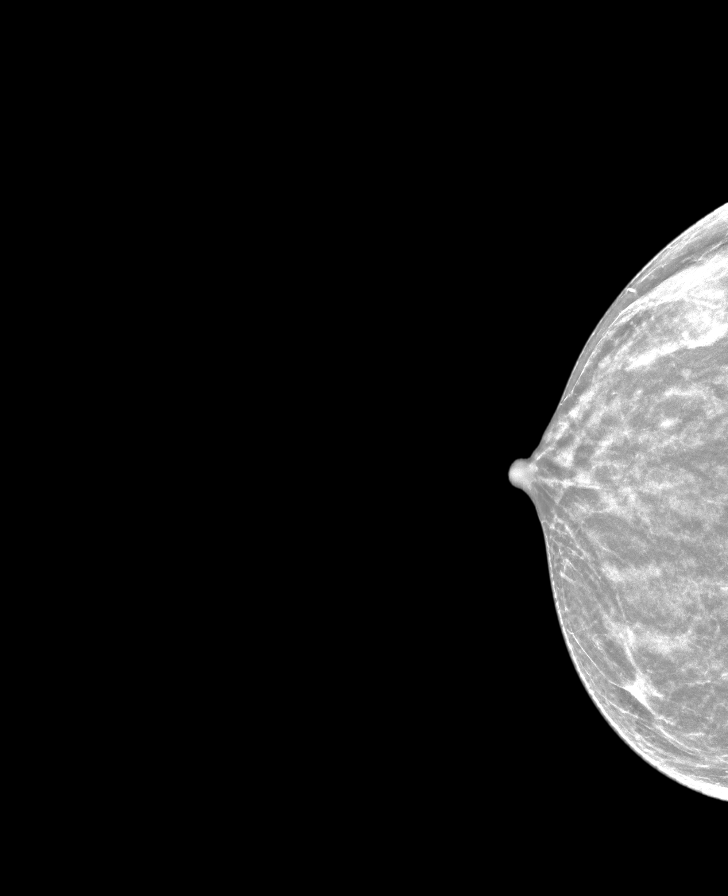

[R MLO synth-2D]
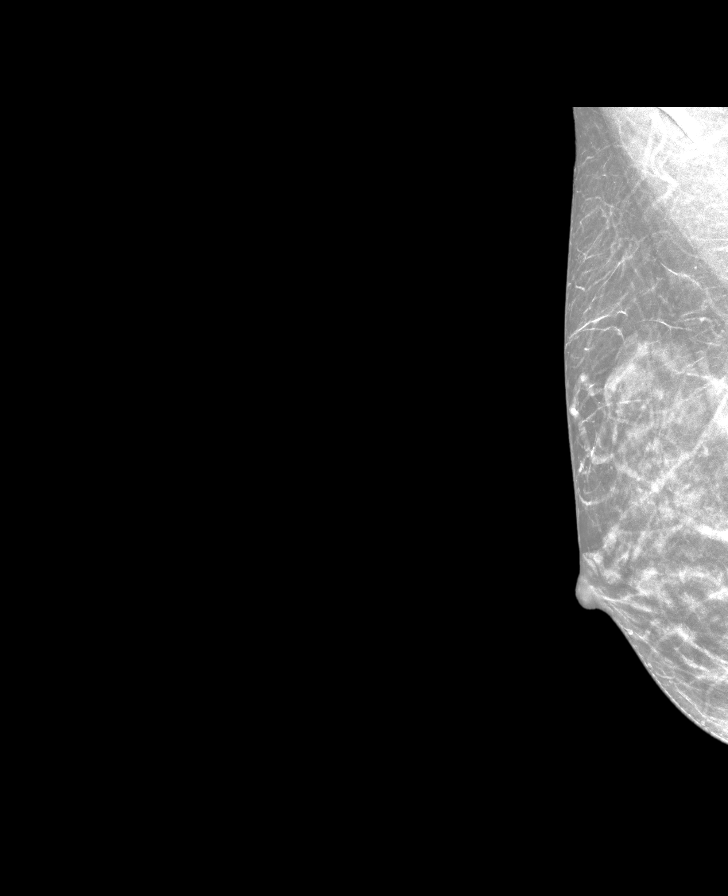

[8 of 28 positions shown; findings below may reference images not displayed]

ACR Breast Density Category c: The breast tissue is heterogeneously
dense, which may obscure small masses.
FINDINGS: The patient has retropectoral implants. There are no findings
suspicious for malignancy.
IMPRESSION: No mammographic evidence of malignancy. A result letter of this
screening mammogram will be mailed directly to the patient.

RECOMMENDATION:
Screening mammogram in one year. (Code:LT-E-7TH)

BI-RADS CATEGORY  1:  Negative.
